# Patient Record
Sex: Female | Born: 1955 | Race: White | Hispanic: No | Smoking: Current every day smoker
Health system: Southern US, Community
[De-identification: ages and names within clinical notes are randomized; demographics above are authoritative.]

## PROBLEM LIST (undated history)

## (undated) DIAGNOSIS — E119 Type 2 diabetes mellitus without complications: Secondary | ICD-10-CM

---

## 2017-11-09 ENCOUNTER — Emergency Department (HOSPITAL_BASED_OUTPATIENT_CLINIC_OR_DEPARTMENT_OTHER): Payer: BLUE CROSS/BLUE SHIELD

## 2017-11-09 ENCOUNTER — Other Ambulatory Visit: Payer: Self-pay

## 2017-11-09 ENCOUNTER — Emergency Department (HOSPITAL_BASED_OUTPATIENT_CLINIC_OR_DEPARTMENT_OTHER)
Admission: EM | Admit: 2017-11-09 | Discharge: 2017-11-10 | Disposition: A | Discharge status: Home / Self Care | Payer: BLUE CROSS/BLUE SHIELD | Attending: Emergency Medicine | Admitting: Emergency Medicine

## 2017-11-09 ENCOUNTER — Encounter (HOSPITAL_BASED_OUTPATIENT_CLINIC_OR_DEPARTMENT_OTHER): Payer: Self-pay

## 2017-11-09 ENCOUNTER — Emergency Department (HOSPITAL_COMMUNITY): Payer: BLUE CROSS/BLUE SHIELD

## 2017-11-09 DIAGNOSIS — H538 Other visual disturbances: Secondary | ICD-10-CM | POA: Insufficient documentation

## 2017-11-09 DIAGNOSIS — R42 Dizziness and giddiness: Secondary | ICD-10-CM | POA: Diagnosis present

## 2017-11-09 DIAGNOSIS — I639 Cerebral infarction, unspecified: Secondary | ICD-10-CM | POA: Diagnosis not present

## 2017-11-09 DIAGNOSIS — R4182 Altered mental status, unspecified: Secondary | ICD-10-CM | POA: Diagnosis not present

## 2017-11-09 HISTORY — DX: Type 2 diabetes mellitus without complications: E11.9

## 2017-11-09 LAB — RAPID URINE DRUG SCREEN, HOSP PERFORMED
AMPHETAMINES: NOT DETECTED
Benzodiazepines: POSITIVE — AB
Cocaine: NOT DETECTED
Opiates: NOT DETECTED
TETRAHYDROCANNABINOL: NOT DETECTED

## 2017-11-09 LAB — CBC
HCT: 37.3 % (ref 36.0–46.0)
Hemoglobin: 13 g/dL (ref 12.0–15.0)
MCH: 31 pg (ref 26.0–34.0)
MCHC: 34.9 g/dL (ref 30.0–36.0)
MCV: 89 fL (ref 78.0–100.0)
PLATELETS: 245 10*3/uL (ref 150–400)
RBC: 4.19 MIL/uL (ref 3.87–5.11)
RDW: 12.9 % (ref 11.5–15.5)
WBC: 7.2 10*3/uL (ref 4.0–10.5)

## 2017-11-09 LAB — TROPONIN I: Troponin I: <0.03 ng/mL (ref <0.03)

## 2017-11-09 LAB — COMPREHENSIVE METABOLIC PANEL
ALBUMIN: 4.3 g/dL (ref 3.5–5.0)
ALK PHOS: 60 U/L (ref 38–126)
ALT: 31 U/L (ref 0–44)
AST: 22 U/L (ref 15–41)
Anion gap: 10 (ref 5–15)
BUN: 19 mg/dL (ref 6–20)
CALCIUM: 9.8 mg/dL (ref 8.9–10.3)
CO2: 25 mmol/L (ref 22–32)
CREATININE: 0.63 mg/dL (ref 0.44–1.00)
Chloride: 104 mmol/L (ref 98–111)
GFR calc Af Amer: >60 mL/min (ref >60)
GFR calc non Af Amer: >60 mL/min (ref >60)
GLUCOSE: 85 mg/dL (ref 70–99)
Potassium: 3.9 mmol/L (ref 3.5–5.1)
SODIUM: 139 mmol/L (ref 135–145)
Total Bilirubin: 0.3 mg/dL (ref 0.3–1.2)
Total Protein: 7 g/dL (ref 6.5–8.1)

## 2017-11-09 LAB — URINALYSIS, ROUTINE W REFLEX MICROSCOPIC
BILIRUBIN URINE: NEGATIVE
Glucose, UA: NEGATIVE mg/dL
Hgb urine dipstick: NEGATIVE
KETONES UR: NEGATIVE mg/dL
LEUKOCYTES UA: NEGATIVE
NITRITE: NEGATIVE
PROTEIN: NEGATIVE mg/dL
Specific Gravity, Urine: 1.01 (ref 1.005–1.030)
pH: 6 (ref 5.0–8.0)

## 2017-11-09 LAB — DIFFERENTIAL
BASOS ABS: 0 10*3/uL (ref 0.0–0.1)
Basophils Relative: 0 %
Eosinophils Absolute: 0.2 10*3/uL (ref 0.0–0.7)
Eosinophils Relative: 2 %
LYMPHS PCT: 40 %
Lymphs Abs: 2.8 10*3/uL (ref 0.7–4.0)
Monocytes Absolute: 0.4 10*3/uL (ref 0.1–1.0)
Monocytes Relative: 6 %
NEUTROS ABS: 3.7 10*3/uL (ref 1.7–7.7)
NEUTROS PCT: 52 %

## 2017-11-09 LAB — PROTIME-INR
INR: 0.94
PROTHROMBIN TIME: 12.5 s (ref 11.4–15.2)

## 2017-11-09 LAB — ETHANOL: Alcohol, Ethyl (B): <10 mg/dL (ref <10)

## 2017-11-09 LAB — CBG MONITORING, ED
GLUCOSE-CAPILLARY: 79 mg/dL (ref 70–99)
Glucose-Capillary: 88 mg/dL (ref 70–99)

## 2017-11-09 LAB — APTT: aPTT: 27 seconds (ref 24–36)

## 2017-11-09 MED ORDER — HALOPERIDOL LACTATE 5 MG/ML IJ SOLN
2.0000 mg | Freq: Once | INTRAMUSCULAR | Status: AC
  Administered 2017-11-09: 2 mg via INTRAVENOUS
  Filled 2017-11-09: qty 1

## 2017-11-09 MED ORDER — PROMETHAZINE HCL 25 MG/ML IJ SOLN
25.0000 mg | Freq: Once | INTRAMUSCULAR | Status: AC
  Administered 2017-11-09: 25 mg via INTRAVENOUS
  Filled 2017-11-09: qty 1

## 2017-11-09 MED ORDER — SODIUM CHLORIDE 0.9 % IV BOLUS
1000.0000 mL | Freq: Once | INTRAVENOUS | Status: AC
  Administered 2017-11-09: 1000 mL via INTRAVENOUS

## 2017-11-09 MED ORDER — LORAZEPAM 2 MG/ML IJ SOLN
2.0000 mg | Freq: Once | INTRAMUSCULAR | Status: AC
  Administered 2017-11-09: 2 mg via INTRAVENOUS
  Filled 2017-11-09: qty 1

## 2017-11-09 MED ORDER — MECLIZINE HCL 25 MG PO TABS
25.0000 mg | ORAL_TABLET | Freq: Once | ORAL | Status: AC
  Administered 2017-11-09: 25 mg via ORAL
  Filled 2017-11-09: qty 1

## 2017-11-09 MED ORDER — METOCLOPRAMIDE HCL 5 MG/ML IJ SOLN
10.0000 mg | Freq: Once | INTRAMUSCULAR | Status: AC
  Administered 2017-11-09: 10 mg via INTRAVENOUS
  Filled 2017-11-09: qty 2

## 2017-11-09 MED ORDER — IOPAMIDOL (ISOVUE-370) INJECTION 76%
100.0000 mL | Freq: Once | INTRAVENOUS | Status: AC | PRN
  Administered 2017-11-09: 100 mL via INTRAVENOUS

## 2017-11-09 NOTE — Progress Notes (Signed)
11/09/17 @ 2111 Pt completely altered, climbed out of scanner and attempting to climb off table. Unable to perform exam in this state.

## 2017-11-09 NOTE — ED Notes (Signed)
Dr. Silverio LayYao notified that pt needs medication prior to MRI.

## 2017-11-09 NOTE — Consult Note (Addendum)
[facility] Tele[facility]urology Consult Services  Date of service:  11/09/17   Impression: Possible stroke, ischemic, localizing to posterior circulation vs. peripheral vestibulopathy (but this would not account for her subjective speech change).  Her age, DM are CV risk factors.  Her last known well is 10:45 but I do not find TPA is currently indicated given NIH SS 0.  I discussed with patient and she understands.  However, if sxs worsen  before 15:15, this may be reconsidered.  Will eval for posterior circ LVO. My clinical suspicion is low.  Her eval is complicated by low to normal BP, which would not be expected in setting of possible acute cerebral ischemic event.  In light of this, must consider and exclude aortic dissection.  Differential diagnosis for stroke mechanism:  1. Cardioembolic stroke  2. Small vessel disease/lacune  3. Thromboembolic, artery-to-artery mechanism  4. Hypercoagulable state-related infarct  5. Transient ischemic attack  6. Thrombotic mechanism, large artery disease   Comments:   Last known well time:  10:45 Door time:  13:15 Time Teleneurologist paged: 13:19 Time Teleneurologist logged in, first attempt:  13:20 [facility] at bedside: 13:24 NIHSS assessment time: 13:24      Recommendations:  STAT CTA head/neck to eval for post circ LVO if present, will require transfer to facility with neuro IR capabilty; though thrombectomy may not be warranted presently given NIH SS 0, she needs to be at place with this capability should she clinically worsen  defer to ED physician to order dx testing to exclude aortic dissection.  [id number] neuro check until 15:15.  Call another SA if she worsens significantly to re-eval option of TPA if aortic dissection excluded  if no worsening by 15:15  and no LVO/aortic dissection, then the following:  aspirin 81mg  qd, first dose now if no medical contraindication  permissive HTN  Admission by Int  Med  Neuroprotective measures suggested:  head of bed elevated no higher than 30 degrees, euglycemia (glucose goal 80-150 ideally, can treat with sliding scale insulin if needed), normal temperature goal < 37.5C, can treat with tylenol as needed), isotonic IV fluids only, avoid hypotension and precipitous drop in BP (treat BP > 220/110 only as permissive HTN encouraged), head of bed elevated no higher than 30 degrees  Inpatient Neurology Consultation Stroke evaluation as per inpatient neurology recommendations  Discussed with ED MD   Please contact [facility] Navigator to reach me if further questions/concerns arise.    ADDENDUM:  CTA head/neck:  Negative  PLAN: Defer to ED physician to order/fu on testing to exclude aortic dissection and care for this if found  No additional acute neuro recs _____________________________________________________________   CC:  dizzy, vertigo, walking trouble, speech change  History of Present Illness: [age]year old woman with DM, migraines who present with onset of "disorientation, dizziness, moving left to right when walking, couldn't type, speech not normal..slow/slurred."  She describes dizziness as vertigo; this was provoked by turning with walking.  She denies prior similar sxs in the past, prior stroke, HTN, HLD, CAD.    She notes mild headache different from her baseline migraine.  She denies chest/low back pain.  She admits to alcohol use 2-3 servings per week with last intake last night.  She denies drug use.  She admits to increase work stressors since May 2019.  Patient denies anticoagulant use, active GI/GU bleeding, hx of ICH/GI or brain malignancy, stroke/head or spine surgery or trauma in last three months, alcohol abuse, liver disease, blood disorder, chemotherapy tx.  Exam:   BP 131/65 then 112/68, glucose 88  NIHSS score: 0  (see below) 1a, 1b, 1c, 2, 3, 4, 5a, 5b, 6a, 6b, 7, 8, 9, 10, 11-- all 0  somewhat variable speech  pattern in that spontaneous speech on interview is normal with word choice, clarity, rhythm/cadence but when formally tested with repetition can do so clearly but does so in a slow/pausing fashion only at this part of exam  gait demonstrates minimal swaying ability to self correct without side base or ataxia, with turning reported to staff that she has vertigo  affect restricted   Imaging: CT head: negative   Medical Data Reviewed:  1.Data reviewed include clinical labs, radiology, medical tests;   2.Tests results discussed with performing or interpreting physician;   3. Prior medical records available reviewed;  4. Additional case history obtained from additional source(s);  5. Independent review of image, tracing or specimen   Medical Decision Making:  - Extensive number of diagnoses or management options considered above.   - Extensive amount of complex data reviewed.   - High risk of complication and/or morbidity or mortality are associated with differential diagnostic considerations above.  - There may be uncertain outcome and increased probability of prolonged functional impairment or high probability of severe prolonged functional impairment associated with some of these differential diagnosis.    Patient was informed the Neurology Consult would happen via TeleHealth consult by way of interactive audio and video telecommunications and consented to receiving care in this manner.

## 2017-11-09 NOTE — ED Triage Notes (Signed)
Pt reports about 10:45 am today she started having trouble typing on her computer, walking a straight line, getting her words out. Pt reports she was walking into things at her work. Pt reports a headache. Pt reports it was hard for her to focus on things and her vision is blurry. MD at bedside.

## 2017-11-09 NOTE — ED Notes (Signed)
Pt to MRI

## 2017-11-09 NOTE — ED Provider Notes (Signed)
[facility] EMERGENCY DEPARTMENT Provider Note   CSN: [id number] Arrival date & time: 11/09/17  1255     History   Chief Complaint Chief Complaint  Patient presents with  . Dizziness  . Altered Mental Status    HPI Valerie Frey is a 62 y.o. female.  Patient is a 62 year old female with a history of diabetes presenting today with acute onset at 1045 today of dizziness which she describes as a spinning sensation, difficulty walking and unable to type.  Patient states she woke up this morning with a mild headache but otherwise felt normal.  She does have a history of migraine headaches but this 1 was a little different and that it was just a mild draggy headache not typical of her migraines.  Around 1045 she was typing at her computer and suddenly was unable to continue typing.  She felt like she could not type the words appropriately and was having some difficulty speaking.  She also felt dizzy at this time when she tried to get up to walk coworker states she was running into the side of the cubicle and running into the wall which is definitely not like her.  She denies any nausea, vomiting, fever, shortness of breath or chest pain.  She is never had anything like this before.  She denies ever having neurologic symptoms with migraines in the past.  No recent head trauma and patient does not take anticoagulation.  She has no prior history of stroke.  Symptoms have not changed since they started at 1045.  The history is provided by the patient and a friend.  Dizziness  Altered Mental Status      No past medical history on file.  There are no active problems to display for this patient.      OB History   None      Home Medications    Prior to Admission medications   Not on File    Family History No family history on file.  Social History Social History   Tobacco Use  . Smoking status: Not on file  Substance Use Topics  . Alcohol use: Not on file  .  Drug use: Not on file     Allergies   Patient has no allergy information on record.   Review of Systems Review of Systems  Neurological: Positive for dizziness.  All other systems reviewed and are negative.    Physical Exam Updated Vital Signs BP 112/68   Pulse 62   Temp 98.4 F (36.9 C) (Oral)   Resp 16   Ht 5' (1.524 m)   Wt 63.5 kg (140 lb)   SpO2 96%   BMI 27.34 kg/m   Physical Exam  Constitutional: She is oriented to person, place, and time. She appears well-developed and well-nourished. No distress.  HENT:  Head: Normocephalic and atraumatic.  Mouth/Throat: Oropharynx is clear and moist.  Eyes: Pupils are equal, round, and reactive to light. Conjunctivae and EOM are normal.  Neck: Normal range of motion. Neck supple.  Cardiovascular: Normal rate, regular rhythm and intact distal pulses.  No murmur heard. Pulmonary/Chest: Effort normal and breath sounds normal. No respiratory distress. She has no wheezes. She has no rales.  Abdominal: Soft. She exhibits no distension. There is no tenderness. There is no rebound and no guarding.  Musculoskeletal: Normal range of motion. She exhibits no edema or tenderness.  Neurological: She is alert and oriented to person, place, and time. She has normal strength. No cranial  nerve deficit or sensory deficit. She displays a negative Romberg sign. Gait abnormal.  When attempting to walk she takes very small steps and is swaying.  Negative Romberg.  When testing for pronator drift arms drift to the left.  No aphasia noted.  Skin: Skin is warm and dry. No rash noted. No erythema.  Psychiatric: She has a normal mood and affect. Her behavior is normal.  Nursing note and vitals reviewed.    ED Treatments / Results  Labs (all labs ordered are listed, but only abnormal results are displayed) Labs Reviewed  RAPID URINE DRUG SCREEN, HOSP PERFORMED - Abnormal; Notable for the following components:      Result Value   Benzodiazepines  POSITIVE (*)    Barbiturates   (*)    Value: Result not available. Reagent lot number recalled by manufacturer.   All other components within normal limits  ETHANOL  PROTIME-INR  APTT  CBC  DIFFERENTIAL  COMPREHENSIVE METABOLIC PANEL  TROPONIN I  URINALYSIS, ROUTINE W REFLEX MICROSCOPIC  CBG MONITORING, ED    EKG None  Radiology Ct Angio Head W Or Wo Contrast  Result Date: 11/09/2017 CLINICAL DATA:  Ataxia. Dizziness and blurred vision. Rule out stroke. Diabetes. EXAM: CT ANGIOGRAPHY HEAD AND NECK TECHNIQUE: Multidetector CT imaging of the head and neck was performed using the standard protocol during bolus administration of intravenous contrast. Multiplanar CT image reconstructions and MIPs were obtained to evaluate the vascular anatomy. Carotid stenosis measurements (when applicable) are obtained utilizing NASCET criteria, using the distal internal carotid diameter as the denominator. CONTRAST:  ISOVUE-370 IOPAMIDOL (ISOVUE-370) INJECTION 76% COMPARISON:  CT head 11/09/2017 FINDINGS: CTA NECK FINDINGS Aortic arch: Mild atherosclerotic calcification aortic arch. Proximal great vessels widely patent and normal Right carotid system: Normal right carotid system without atherosclerotic disease. Negative for dissection or stenosis Left carotid system: Normal left carotid system without atherosclerotic disease. Negative for stenosis or dissection Vertebral arteries: Both vertebral arteries widely patent without stenosis. Skeleton: negative Other neck: Negative for mass or adenopathy in the neck. Upper chest: Azygos lobe fissure on the right. Mild apical emphysema and scarring bilaterally. Review of the MIP images confirms the above findings CTA HEAD FINDINGS Anterior circulation: Cavernous carotid is normal bilaterally. Anterior and middle cerebral arteries normal Posterior circulation: Both vertebral arteries widely patent to the basilar. Basilar widely patent. PICA, AICA, superior cerebellar,  and posterior cerebral arteries patent bilaterally without stenosis. Venous sinuses: Normal Anatomic variants: None Delayed phase: Normal enhancement on delayed imaging Review of the MIP images confirms the above findings IMPRESSION: Essentially normal CTA head and neck. No significant stenosis. Minimal atherosclerotic disease aortic arch. Apical emphysema and scarring bilaterally. Electronically Signed   By: Marlan Palau M.D.   On: 11/09/2017 14:55   Ct Angio Neck W And/or Wo Contrast  Result Date: 11/09/2017 CLINICAL DATA:  Ataxia. Dizziness and blurred vision. Rule out stroke. Diabetes. EXAM: CT ANGIOGRAPHY HEAD AND NECK TECHNIQUE: Multidetector CT imaging of the head and neck was performed using the standard protocol during bolus administration of intravenous contrast. Multiplanar CT image reconstructions and MIPs were obtained to evaluate the vascular anatomy. Carotid stenosis measurements (when applicable) are obtained utilizing NASCET criteria, using the distal internal carotid diameter as the denominator. CONTRAST:  ISOVUE-370 IOPAMIDOL (ISOVUE-370) INJECTION 76% COMPARISON:  CT head 11/09/2017 FINDINGS: CTA NECK FINDINGS Aortic arch: Mild atherosclerotic calcification aortic arch. Proximal great vessels widely patent and normal Right carotid system: Normal right carotid system without atherosclerotic disease. Negative for dissection or  stenosis Left carotid system: Normal left carotid system without atherosclerotic disease. Negative for stenosis or dissection Vertebral arteries: Both vertebral arteries widely patent without stenosis. Skeleton: negative Other neck: Negative for mass or adenopathy in the neck. Upper chest: Azygos lobe fissure on the right. Mild apical emphysema and scarring bilaterally. Review of the MIP images confirms the above findings CTA HEAD FINDINGS Anterior circulation: Cavernous carotid is normal bilaterally. Anterior and middle cerebral arteries normal Posterior  circulation: Both vertebral arteries widely patent to the basilar. Basilar widely patent. PICA, AICA, superior cerebellar, and posterior cerebral arteries patent bilaterally without stenosis. Venous sinuses: Normal Anatomic variants: None Delayed phase: Normal enhancement on delayed imaging Review of the MIP images confirms the above findings IMPRESSION: Essentially normal CTA head and neck. No significant stenosis. Minimal atherosclerotic disease aortic arch. Apical emphysema and scarring bilaterally. Electronically Signed   By: Marlan Palauharles  Clark M.D.   On: 11/09/2017 14:55   Ct Head Code Stroke Wo Contrast  Addendum Date: 11/09/2017   ADDENDUM REPORT: 11/09/2017 14:48 ADDENDUM: Study discussed by telephone with Dr. Gwyneth SproutWHITNEY Jinan Biggins on 11/09/2017 at 1328 hours. Electronically Signed   By: Odessa FlemingH  Hall M.D.   On: 11/09/2017 14:48   Result Date: 11/09/2017 CLINICAL DATA:  Code stroke. 62 year old female with dizziness and blurred vision for 3 hours. EXAM: CT HEAD WITHOUT CONTRAST TECHNIQUE: Contiguous axial images were obtained from the base of the skull through the vertex without intravenous contrast. COMPARISON:  None. FINDINGS: Brain: Normal cerebral volume. No midline shift, ventriculomegaly, mass effect, evidence of mass lesion, intracranial hemorrhage or evidence of cortically based acute infarction. Gray-white matter differentiation is within normal limits throughout the brain. Vascular: Calcified atherosclerosis at the skull base. No suspicious intracranial vascular hyperdensity. Skull: Negative. Sinuses/Orbits: Trace fluid layering in the left sphenoid sinus. Other visible paranasal sinuses and mastoids are well pneumatized. The bilateral tympanic cavities are clear. Other: Visualized orbits and scalp soft tissues are within normal limits. ASPECTS [facility]([place] Stroke Program Early CT Score) - Ganglionic level infarction (caudate, lentiform nuclei, internal capsule, insula, M1-M3 cortex): 7 - Supraganglionic  infarction (M4-M6 cortex): 3 Total score (0-10 with 10 being normal): 10 IMPRESSION: 1. Normal noncontrast CT appearance of the brain. ASPECTS is 10 2. Trace fluid in the left sphenoid sinus, but significance doubtful in light of otherwise normal paranasal sinus aeration. Electronically Signed: By: Odessa FlemingH  Hall M.D. On: 11/09/2017 13:28    Procedures Procedures (including critical care time)  Medications Ordered in ED Medications - No data to display   Initial Impression / Assessment and Plan / ED Course  I have reviewed the triage vital signs and the nursing notes.  Pertinent labs & imaging results that were available during my care of the patient were reviewed by me and considered in my medical decision making (see chart for details).    Patient presenting today with concern for possible code stroke.  Symptoms started at 1045 today when she was last seen normal.  Patient is ataxic and initially was having some speech issues and difficulty typing but is not aphasic here.  She states her symptoms have not significantly improved since they started.  She does have a history of migraine headaches but is never had complex migraines in the past.  She is complaining of a headache here.  Blood sugar is 88.  No prior history of stroke.  Code stroke was activated.  Labs and CT are pending.  Tele-neurology pending  1:52 PM Our initial NIH is 3 however with tele-neurology she  documents an NIH of 0.  She discussed TPA with the patient but since his not a great candidate at this time except that she is within the window.  Tele-neurology recommends CTA of the head, neck and CT dissection of the chest to ensure no evidence of aortic dissection.  Patient is complaining of no chest pain or shortness of breath.  Blood pressures have been relatively low at 112/68.  Patient also will be given Reglan for possible complex migraine.   3:41 PM Our radiology techs here stated they could not do the CT dissection study of  the chest based on timing.  However low suspicion for dissection as patient has no chest pain, shortness of breath and other than diabetes has no risk factors.  She has equal pulses bilaterally and is denying any numbness or tingling in her arms or legs.  Labs are positive for benzodiazepines but no other acute findings.  CTA of head and neck neg.  Spoke with Dr. Tia Alert and feel if pt's MRI is neg she can be treated for peripheral vertigo and d/c but if positive can be admitted for stroke work up.  Final Clinical Impressions(s) / ED Diagnoses   Final diagnoses:  Dizziness    ED Discharge Orders    None       Gwyneth Sprout, MD 11/09/17 1546

## 2017-11-09 NOTE — ED Provider Notes (Signed)
  Physical Exam  BP 97/83   Pulse 82   Temp 98.4 F (36.9 C)   Resp 13   Ht 5' (1.524 m)   Wt 63.5 kg (140 lb)   SpO2 99%   BMI 27.34 kg/m   Physical Exam  ED Course/Procedures     Procedures  MDM  [facility]. CTA head/neck unremarkable. NIH 0. Has nl strength and sensation throughout. Patient still has headaches so will give phenergan.   9 pm Patient was agitated in MRI. Ordered ativan 2 mg IV.   10:30 pm Patient still unable to tolerate MRI. Patient back to her room and sleeping comfortably. I talked to Dr. Otelia LimesLindzen from neurology. He wants me to try MRI brain again.   11:32 PM MRI pending. Will give haldol right before MRI. Signed out to Dr. Blinda LeatherwoodPollina in the ED.      Charlynne PanderYao, David Hsienta, MD 11/09/17 [phone]

## 2017-11-10 MED ORDER — MECLIZINE HCL 25 MG PO TABS: 25.0000 mg | ORAL_TABLET | Freq: Three times a day (TID) | ORAL | 0 refills | Status: AC | PRN

## 2017-11-10 NOTE — ED Notes (Signed)
Pt's friend is going home.  Please call her once a decision is made on pt's disposition.

## 2017-11-10 NOTE — ED Notes (Signed)
Pt to MRI again.

## 2017-11-10 NOTE — ED Provider Notes (Signed)
Assumed care of patient while MRI was pending.  MRI has been performed and results are normal brain.  No evidence of stroke or other abnormality.  Patient will be appropriate for discharge, outpatient follow-up with PCP, neurology.  Treatment with meclizine.   Valerie Frey, Christopher J, MD 11/10/17 [id number]

## 2017-11-10 NOTE — Discharge Instructions (Signed)
Schedule follow-up with your doctor as well as 1 of the 2 listed neurology groups for repeat evaluation.

## 2021-07-21 ENCOUNTER — Encounter (HOSPITAL_COMMUNITY): Payer: Self-pay | Admitting: Emergency Medicine

## 2021-07-21 ENCOUNTER — Emergency Department (HOSPITAL_COMMUNITY)
Admission: EM | Admit: 2021-07-21 | Discharge: 2021-07-21 | Disposition: A | Discharge status: Home / Self Care | Payer: Worker's Compensation | Attending: Emergency Medicine | Admitting: Emergency Medicine

## 2021-07-21 ENCOUNTER — Emergency Department (HOSPITAL_COMMUNITY): Payer: Worker's Compensation

## 2021-07-21 DIAGNOSIS — W19XXXA Unspecified fall, initial encounter: Secondary | ICD-10-CM

## 2021-07-21 DIAGNOSIS — S52131A Displaced fracture of neck of right radius, initial encounter for closed fracture: Secondary | ICD-10-CM | POA: Diagnosis not present

## 2021-07-21 DIAGNOSIS — E119 Type 2 diabetes mellitus without complications: Secondary | ICD-10-CM | POA: Diagnosis not present

## 2021-07-21 DIAGNOSIS — S42401A Unspecified fracture of lower end of right humerus, initial encounter for closed fracture: Secondary | ICD-10-CM

## 2021-07-21 DIAGNOSIS — M25532 Pain in left wrist: Secondary | ICD-10-CM | POA: Insufficient documentation

## 2021-07-21 DIAGNOSIS — Y990 Civilian activity done for income or pay: Secondary | ICD-10-CM | POA: Diagnosis not present

## 2021-07-21 DIAGNOSIS — S0181XA Laceration without foreign body of other part of head, initial encounter: Secondary | ICD-10-CM | POA: Diagnosis not present

## 2021-07-21 DIAGNOSIS — S59901A Unspecified injury of right elbow, initial encounter: Secondary | ICD-10-CM | POA: Diagnosis present

## 2021-07-21 DIAGNOSIS — Z794 Long term (current) use of insulin: Secondary | ICD-10-CM | POA: Diagnosis not present

## 2021-07-21 DIAGNOSIS — Z23 Encounter for immunization: Secondary | ICD-10-CM | POA: Insufficient documentation

## 2021-07-21 DIAGNOSIS — W01198A Fall on same level from slipping, tripping and stumbling with subsequent striking against other object, initial encounter: Secondary | ICD-10-CM | POA: Insufficient documentation

## 2021-07-21 MED ORDER — TETANUS-DIPHTH-ACELL PERTUSSIS 5-2.5-18.5 LF-MCG/0.5 IM SUSY
0.5000 mL | PREFILLED_SYRINGE | Freq: Once | INTRAMUSCULAR | Status: AC
  Administered 2021-07-21: 0.5 mL via INTRAMUSCULAR
  Filled 2021-07-21: qty 0.5

## 2021-07-21 MED ORDER — OXYCODONE HCL 5 MG PO TABS: 5.0000 mg | ORAL_TABLET | Freq: Four times a day (QID) | ORAL | 0 refills | Status: AC | PRN

## 2021-07-21 MED ORDER — ACETAMINOPHEN 325 MG PO TABS
650.0000 mg | ORAL_TABLET | Freq: Once | ORAL | Status: AC
  Administered 2021-07-21: 650 mg via ORAL
  Filled 2021-07-21: qty 2

## 2021-07-21 MED ORDER — LIDOCAINE HCL (PF) 1 % IJ SOLN
10.0000 mL | Freq: Once | INTRAMUSCULAR | Status: AC
  Administered 2021-07-21: 10 mL
  Filled 2021-07-21: qty 30

## 2021-07-21 MED ORDER — BACITRACIN ZINC 500 UNIT/GM EX OINT
TOPICAL_OINTMENT | Freq: Two times a day (BID) | CUTANEOUS | Status: DC
  Filled 2021-07-21: qty 2.7

## 2021-07-21 NOTE — ED Notes (Signed)
Pt transported to CT ?

## 2021-07-21 NOTE — ED Notes (Signed)
Ortho at bedside.

## 2021-07-21 NOTE — Discharge Instructions (Addendum)
3 absorbable sutures were placed in your face today.  They should dissolve on their own and therefore no follow-up is required for removal.  I do however recommend follow-up with your primary care doctor for a wound check in 1 week.  In the interim, please rinse your wound with soap and water and pat dry, do not scrub. ? ?Additionally, you have an elbow fracture which will need to be followed up with orthopedics.  I have given you a referral with a number to call to schedule an appointment in the next few days for continued evaluation and management of this. ? ?Return if development of any new or worsening symptoms ?

## 2021-07-21 NOTE — ED Triage Notes (Signed)
Per pt, states she had a mechanical fall at work-no LOC, did not hit head-complaining of right wrist/arm pain, left arm pain, right face pain and small laceration above right side of lip-no blood thinners ?

## 2021-07-21 NOTE — ED Provider Notes (Signed)
She is afebrile, nontoxic-appearing, and in no acute distress with reassuring vital signs.  She ?[facility]-EMERGENCY DEPT ?Provider Note ? ? ?CSN: [id number] ?Arrival date & time: 07/21/21  1534 ? ?  ? ?History ? ?Chief Complaint  ?Patient presents with  ? Fall  ? ? ?Valerie PippinsSelena Frey is a 66 y.o. female. ? ?Patient with history of diabetes presents today with complaints of fall.  She states that same occurred while she was working today she tripped and fell face first on carpet over concrete.  States the she attempted to catch herself with her right arm but her face struck the ground.  She denies any loss of consciousness, she was able to get off the floor and ambulate without assistance.  She is not anticoagulated.  She endorses headache, right-sided face pain, right shoulder, elbow, and wrist pain with left wrist pain as well.  She denies fevers, chills, vision changes, lightheadedness, dizziness, chest pain, shortness of breath.  Small cut noted to her face, unknown date of last tetanus. ? ?The history is provided by the patient. No language interpreter was used.  ?Fall ?Associated symptoms include headaches.  ? ?  ? ?Home Medications ?Prior to Admission medications   ?Medication Sig Start Date End Date Taking? Authorizing Provider  ?celecoxib (CELEBREX) 100 MG capsule Take 100 mg by mouth 2 (two) times daily. 11/04/17   [provider]  ?LANTUS SOLOSTAR 100 UNIT/ML Solostar Pen Inject 2-10 Units into the skin 3 (three) times daily. 11/07/17   [provider]  ?meclizine (ANTIVERT) 25 MG tablet Take 1 tablet (25 mg total) by mouth 3 (three) times daily as needed for dizziness. 11/10/17   Gilda CreasePollina, Christopher J, MD  ?nystatin (MYCOSTATIN) 100000 UNIT/ML suspension Take 5 mLs by mouth 4 (four) times daily. 10/19/17   [provider]  ?PREMPRO 0.3-1.5 MG tablet Take 1 tablet by mouth daily. 10/29/17   [provider]  ?   ? ?Allergies    ?Sulfa antibiotics, Codeine,  and Metformin   ? ?Review of Systems   ?Review of Systems  ?Constitutional:  Negative for chills and fever.  ?Eyes:  Negative for visual disturbance.  ?Gastrointestinal:  Negative for diarrhea, nausea and vomiting.  ?Musculoskeletal:  Positive for arthralgias and myalgias. Negative for back pain, gait problem, neck pain and neck stiffness.  ?Neurological:  Positive for headaches. Negative for dizziness, tremors, seizures, syncope, facial asymmetry, speech difficulty, weakness, light-headedness and numbness.  ?Psychiatric/Behavioral:  Negative for confusion and decreased concentration.   ?All other systems reviewed and are negative. ? ?Physical Exam ?Updated Vital Signs ?BP 131/79 (BP Location: Right Arm)   Pulse 87   Temp 99.4 ?F (37.4 ?C) (Oral)   Resp 18   SpO2 96%  ?Physical Exam ?Vitals and nursing note reviewed.  ?Constitutional:   ?   General: She is not in acute distress. ?   Appearance: Normal appearance. She is normal weight. She is not ill-appearing, toxic-appearing or diaphoretic.  ?HENT:  ?   Head: Normocephalic and atraumatic.  ?   Comments: No battle sign or raccoon eyes ? ?1 cm curved laceration noted to the philtrum on the right side.  No active bleeding. ? ?Face and jaw without trismus or crepitus. ?Eyes:  ?   Extraocular Movements: Extraocular movements intact.  ?   Pupils: Pupils are equal, round, and reactive to light.  ?Cardiovascular:  ?   Rate and Rhythm: Normal rate.  ?Pulmonary:  ?   Effort: Pulmonary effort is normal. No  respiratory distress.  ?Abdominal:  ?   General: Abdomen is flat.  ?   Palpations: Abdomen is soft.  ?Musculoskeletal:  ?   Cervical back: Normal range of motion and neck supple.  ?   Comments: Tenderness noted to palpation of the right shoulder, right elbow, and right wrist as well as the left wrist.  Decreased ROM due to pain.  No obvious swelling or deformity noted.  Capillary refill less than 2 seconds.  Radial pulses 2+ bilaterally.  No tenderness to palpation of  cervical, thoracic, or lumbar spine.  ?Skin: ?   General: Skin is warm and dry.  ?Neurological:  ?   General: No focal deficit present.  ?   Mental Status: She is alert.  ?Psychiatric:     ?   Mood and Affect: Mood normal.     ?   Behavior: Behavior normal.  ? ? ?ED Results / Procedures / Treatments   ?Labs ?(all labs ordered are listed, but only abnormal results are displayed) ?Labs Reviewed - No data to display ? ?EKG ?None ? ?Radiology ?DG Shoulder Right ? ?Result Date: 07/21/2021 ?CLINICAL DATA:  Status post fall. EXAM: RIGHT SHOULDER - 2+ VIEW COMPARISON:  None. FINDINGS: There is no evidence of fracture or dislocation. Mild to moderate severity degenerative changes are seen involving the right acromioclavicular joint. Soft tissues are unremarkable. IMPRESSION: Mild to moderate severity degenerative changes of the right acromioclavicular joint. Electronically Signed   By: Aram Candela M.D.   On: 07/21/2021 17:14  ? ?DG Elbow Complete Right ? ?Result Date: 07/21/2021 ?CLINICAL DATA:  Status post fall. EXAM: RIGHT ELBOW - COMPLETE 3+ VIEW COMPARISON:  None. FINDINGS: In ill-defined deformity of indeterminate age is seen involving the neck of the proximal right radius. A small chronic deformity is seen along the coronoid process of the proximal right ulna. There is no evidence of dislocation. A posterior fat pad is seen along the distal right humerus. IMPRESSION: 1. Age-indeterminate fracture of the neck of the proximal right radius. 2. Small chronic deformity of the coronoid process of the proximal right ulna. 3. Posterior humeral fat pad visualization, suggestive of an occult, nondisplaced fracture. MRI correlation is recommended. Electronically Signed   By: Aram Candela M.D.   On: 07/21/2021 17:13  ? ?DG Wrist Complete Left ? ?Result Date: 07/21/2021 ?CLINICAL DATA:  Status post fall. EXAM: LEFT WRIST - COMPLETE 3+ VIEW COMPARISON:  None. FINDINGS: There is no evidence of fracture or dislocation. Small  radiopaque fixation plates are seen along the base of the first left metacarpal and proximal aspect of the second left metacarpal. Mild degenerative changes are seen along the carpometacarpal articulation of the left thumb. Soft tissues are unremarkable. IMPRESSION: 1. No acute findings. 2. Postoperative changes involving the first and second metacarpals. Electronically Signed   By: Aram Candela M.D.   On: 07/21/2021 17:07  ? ?DG Wrist Complete Right ? ?Result Date: 07/21/2021 ?CLINICAL DATA:  Status post fall. EXAM: RIGHT WRIST - COMPLETE 3+ VIEW COMPARISON:  None. FINDINGS: There is no evidence of fracture or dislocation. Moderate to marked severity chronic and degenerative changes are seen involving the right trapezium and right capitate bones as well as the carpometacarpal articulation of the right thumb. Soft tissues are unremarkable. IMPRESSION: Moderate to marked severity chronic and degenerative changes as described above. Electronically Signed   By: Aram Candela M.D.   On: 07/21/2021 17:09  ? ?CT Head Wo Contrast ? ?Result Date: 07/21/2021 ?CLINICAL DATA:  Mechanical  fall, right facial pain and laceration EXAM: CT HEAD WITHOUT CONTRAST CT MAXILLOFACIAL WITHOUT CONTRAST CT CERVICAL SPINE WITHOUT CONTRAST TECHNIQUE: Multidetector CT imaging of the head, cervical spine, and maxillofacial structures were performed using the standard protocol without intravenous contrast. Multiplanar CT image reconstructions of the cervical spine and maxillofacial structures were also generated. RADIATION DOSE REDUCTION: This exam was performed according to the departmental dose-optimization program which includes automated exposure control, adjustment of the mA and/or kV according to patient size and/or use of iterative reconstruction technique. COMPARISON:  Multiple exams, including 11/09/2017 and MRI of 11/10/2017 FINDINGS: CT HEAD FINDINGS Brain: The brainstem, cerebellum, cerebral peduncles, thalami, basal ganglia,  basilar cisterns, and ventricular system appear within normal limits. No intracranial hemorrhage, mass lesion, or acute CVA. Vascular: Unremarkable Skull: Unremarkable Other: No supplemental non-categorized f

## 2023-08-22 IMAGING — CT CT MAXILLOFACIAL W/O CM
3 series · 14 of 47 positions shown, 16 images · non-contrast
Comparison: Multiple exams, including 11/09/2017 and MRI of
11/10/2017

CLINICAL DATA: Mechanical fall, right facial pain and laceration



[Series 3: max soft · axial · 0.30mm/px · z∈[+1157,+1293]mm · 8 of 80 slices shown, 10 images]
[im 6/80  brain]
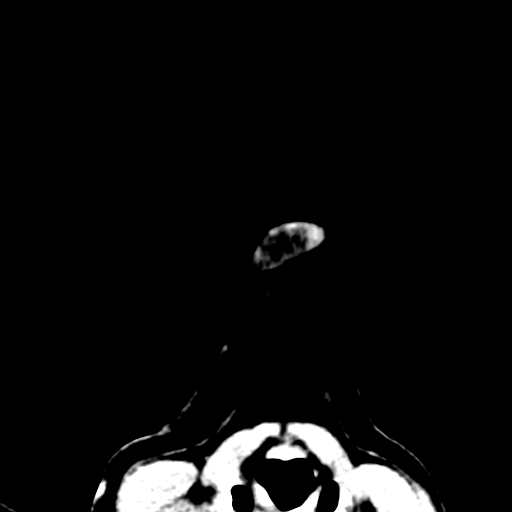
[im 6/80  bone]
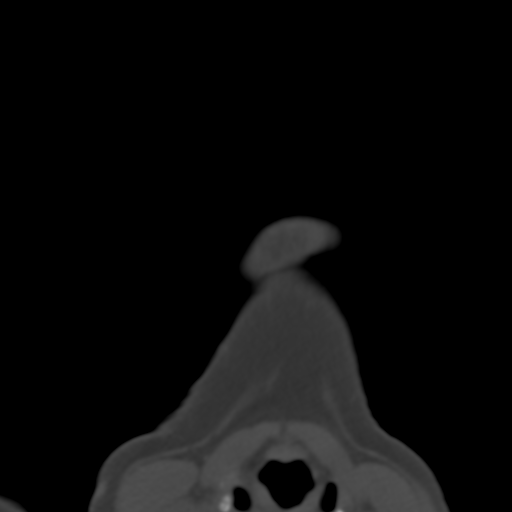
[im 17/80  bone]
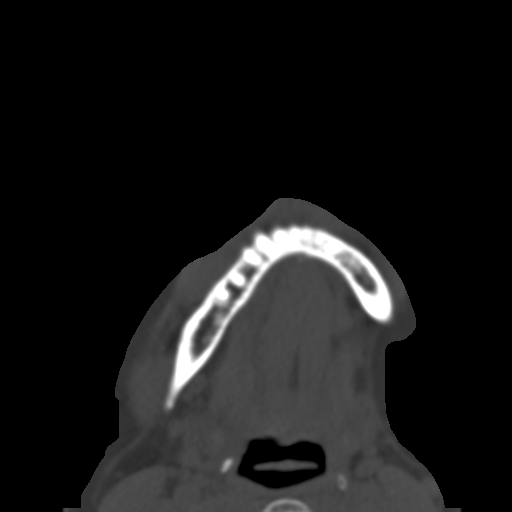
[im 25/80  bone]
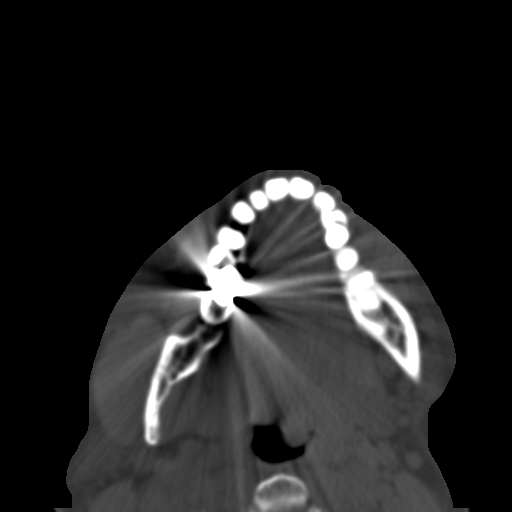
[im 36/80  bone]
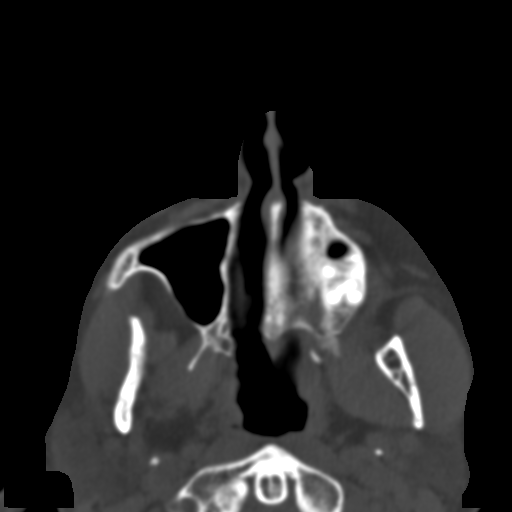
[im 44/80  brain]
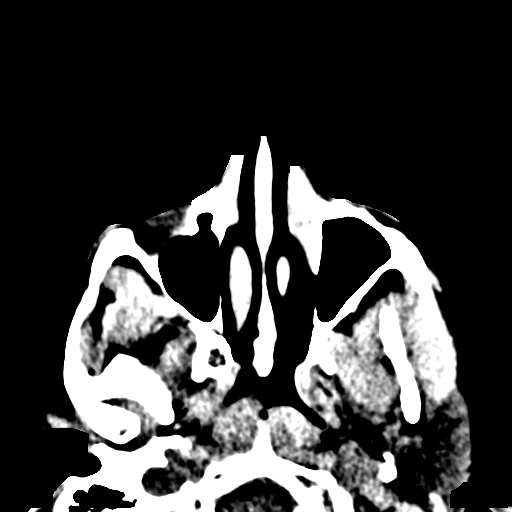
[im 44/80  bone]
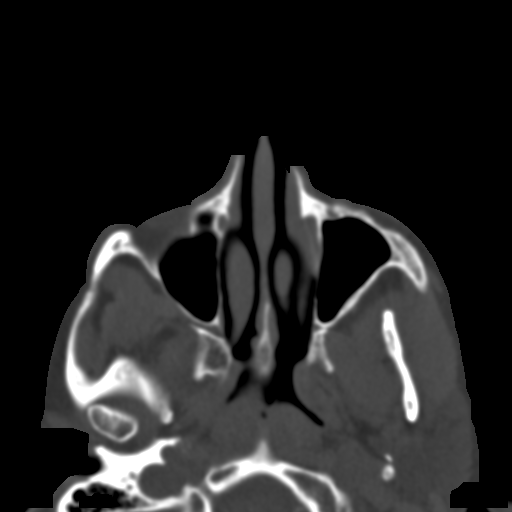
[im 55/80  bone]
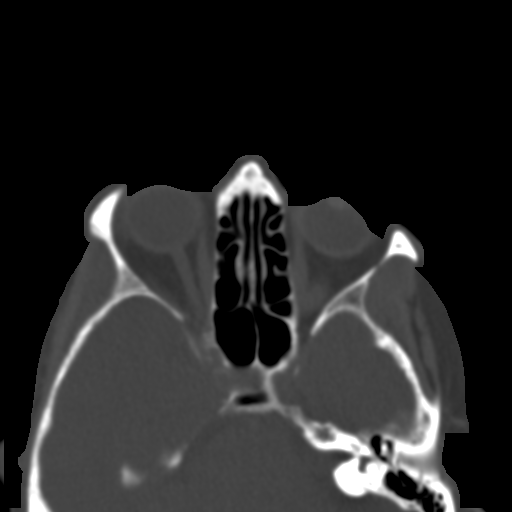
[im 63/80  bone]
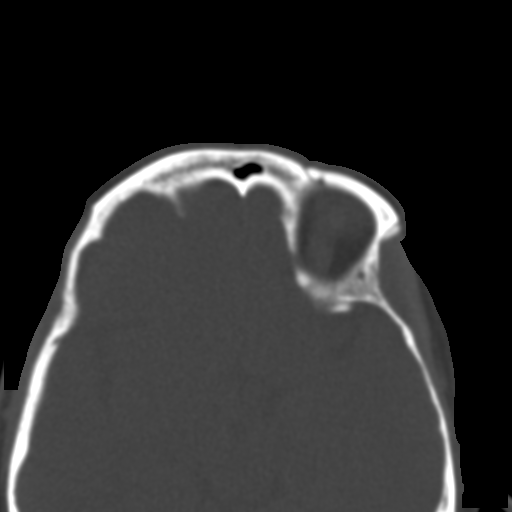
[im 74/80  bone]
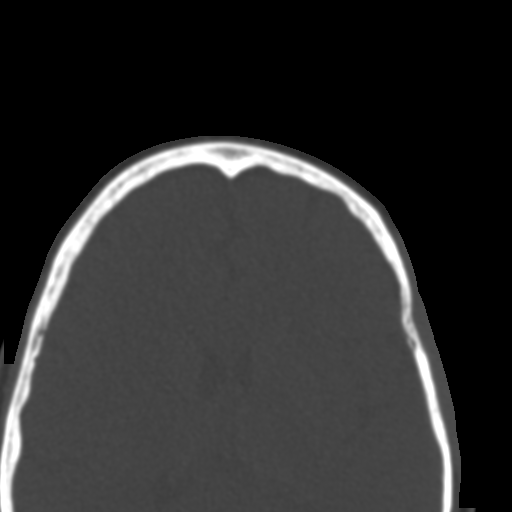

[Series 7: coronal soft · coronal · 0.31mm/px · 3 of 70 slices shown]
[im 24/70  bone]
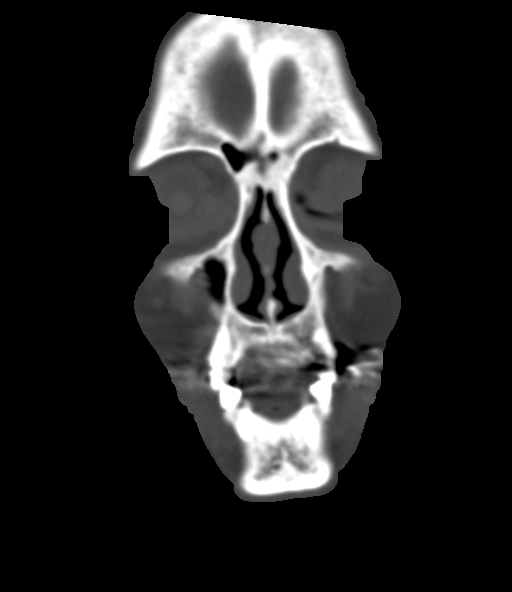
[im 31/70  bone]
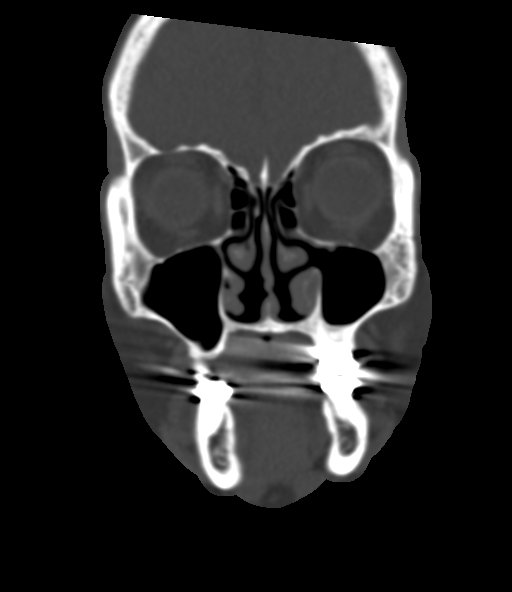
[im 39/70  bone]
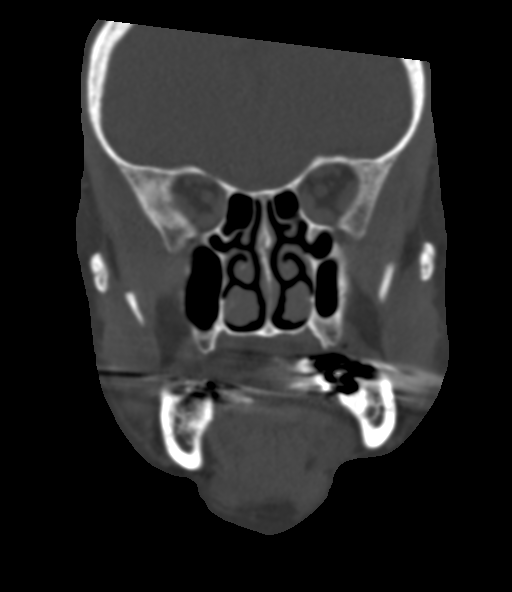

[Series 8: sagittal soft · sagittal · 0.27mm/px · 3 of 81 slices shown]
[im 27/81  bone]
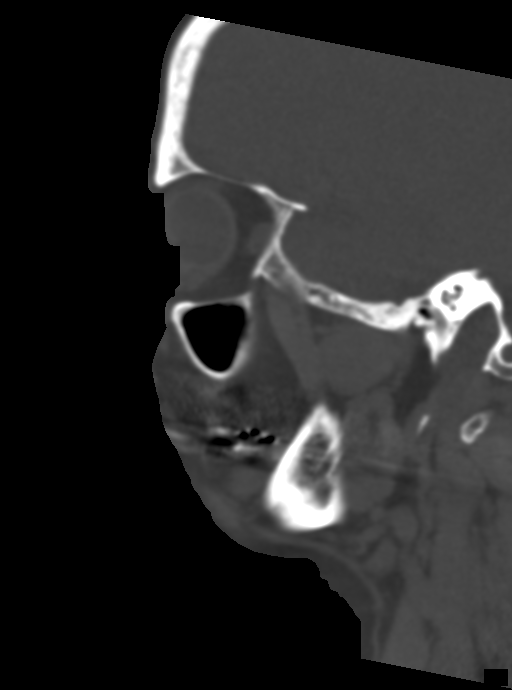
[im 41/81  bone]
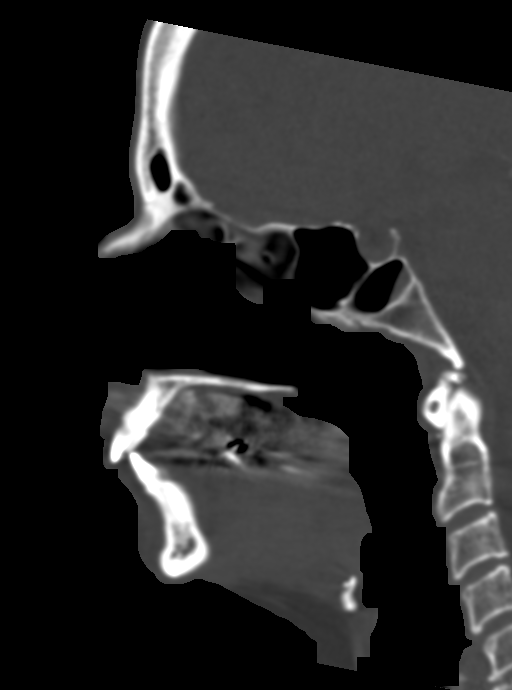
[im 54/81  bone]
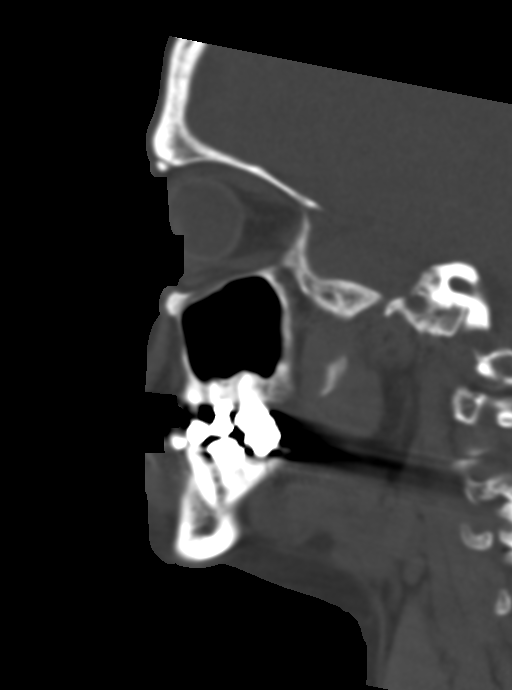

[14 of 47 positions shown; findings below may reference images not displayed]

FINDINGS: CT HEAD FINDINGS

Brain: The brainstem, cerebellum, cerebral peduncles, thalami, basal
ganglia, basilar cisterns, and ventricular system appear within
normal limits. No intracranial hemorrhage, mass lesion, or acute
CVA.

Vascular: Unremarkable

Skull: Unremarkable

Other: No supplemental non-categorized findings.

CT MAXILLOFACIAL FINDINGS

Osseous: No facial fracture identified.

Orbits: Unremarkable

Sinuses: Mild chronic left sphenoid sinusitis. Suspected prior left
maxillary antrostomy.

Soft tissues: Small soft tissue defect just above the right upper
lip on image 53 series 4 could represent the reported laceration.

CT CERVICAL SPINE FINDINGS

Alignment: No vertebral subluxation is observed.

Skull base and vertebrae: No cervical spine fracture or acute bony
abnormality. Loss of intervertebral disc height at C5-6 attributable
to degenerative disc disease. Multilevel degenerative facet
arthropathy.

Soft tissues and spinal canal: Unremarkable

Disc levels: Uncinate and facet spurring cause mild osseous
foraminal stenosis on the right at C4-5, and prominent left
foraminal stenosis at C5-6. Suspected borderline central narrowing
of the thecal sac at C5-6.

Upper chest: Biapical pleuroparenchymal scarring. Emphysema noted at
the lung apices.

Other: No supplemental non-categorized findings.
IMPRESSION: 1. No acute intracranial findings. No facial fracture or cervical
spine fracture.
2. Small soft tissue defect just above the right upper lip
potentially reflecting the reported laceration.
3. Spurring causes prominent left foraminal impingement at C5-6 and
mild right foraminal impingement at C4-5.
4.  Emphysema ([id number]).
5. Mild chronic left sphenoid sinusitis.

## 2023-08-22 IMAGING — CR DG WRIST COMPLETE 3+V*R*
4 series · 4 of 4 positions shown · non-contrast
Comparison: None.

CLINICAL DATA: Status post fall.

EXAM:
RIGHT WRIST - COMPLETE 3+ VIEW

[x wrist pa right]
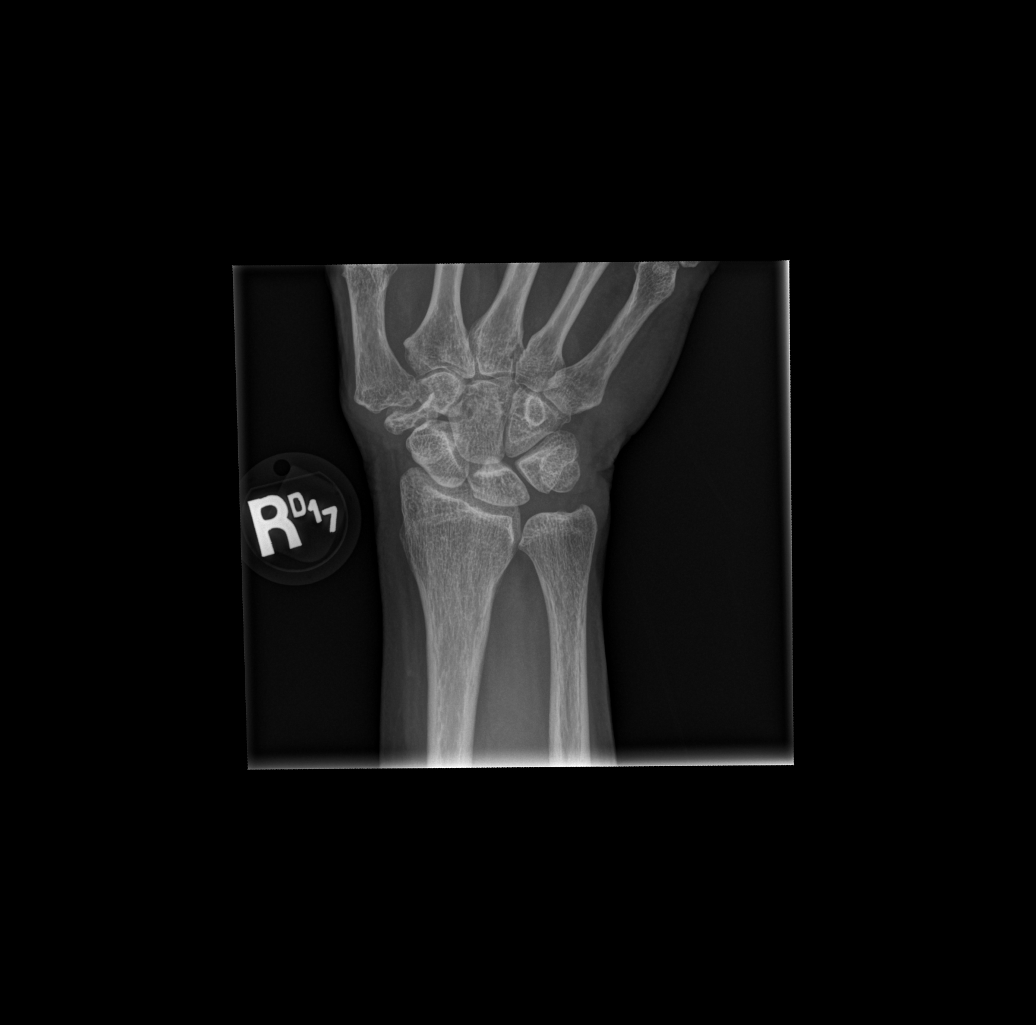

[x wrist obl right]
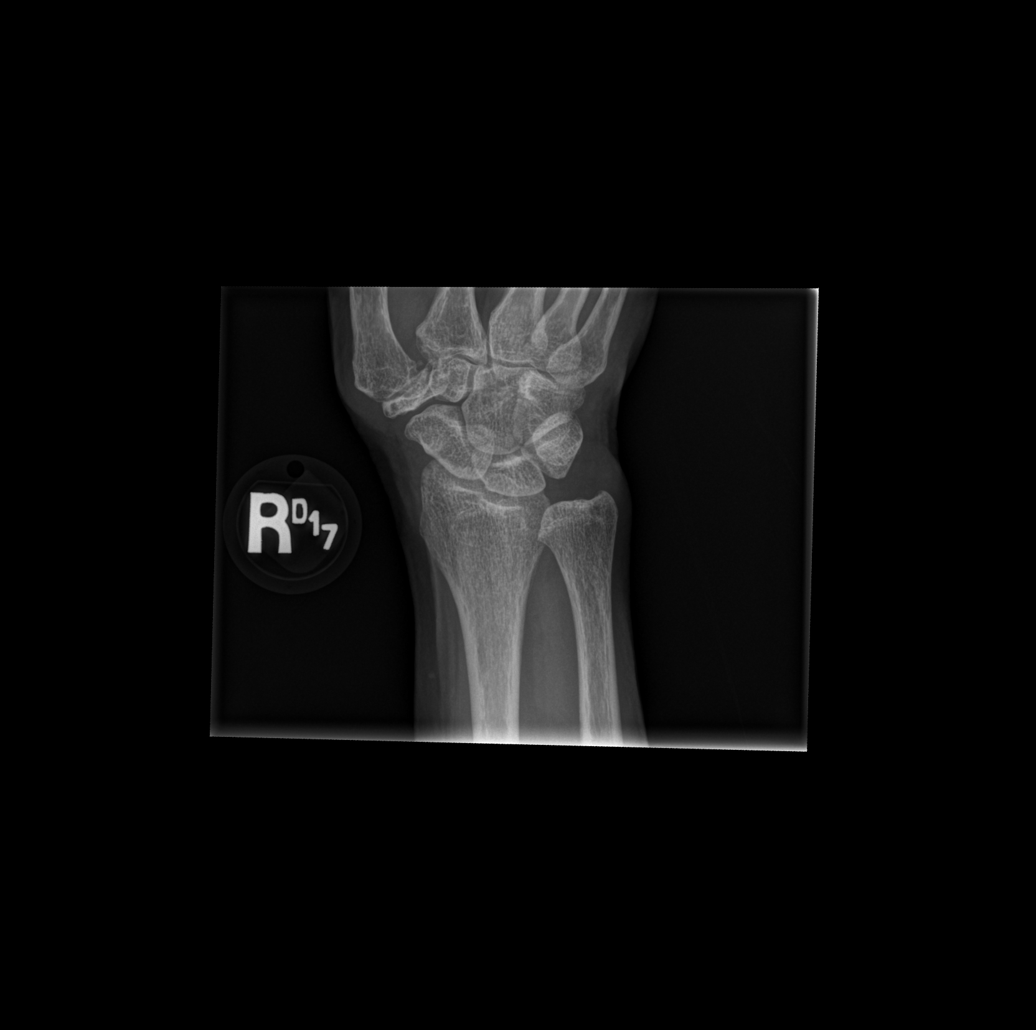

[x wrist lat right]
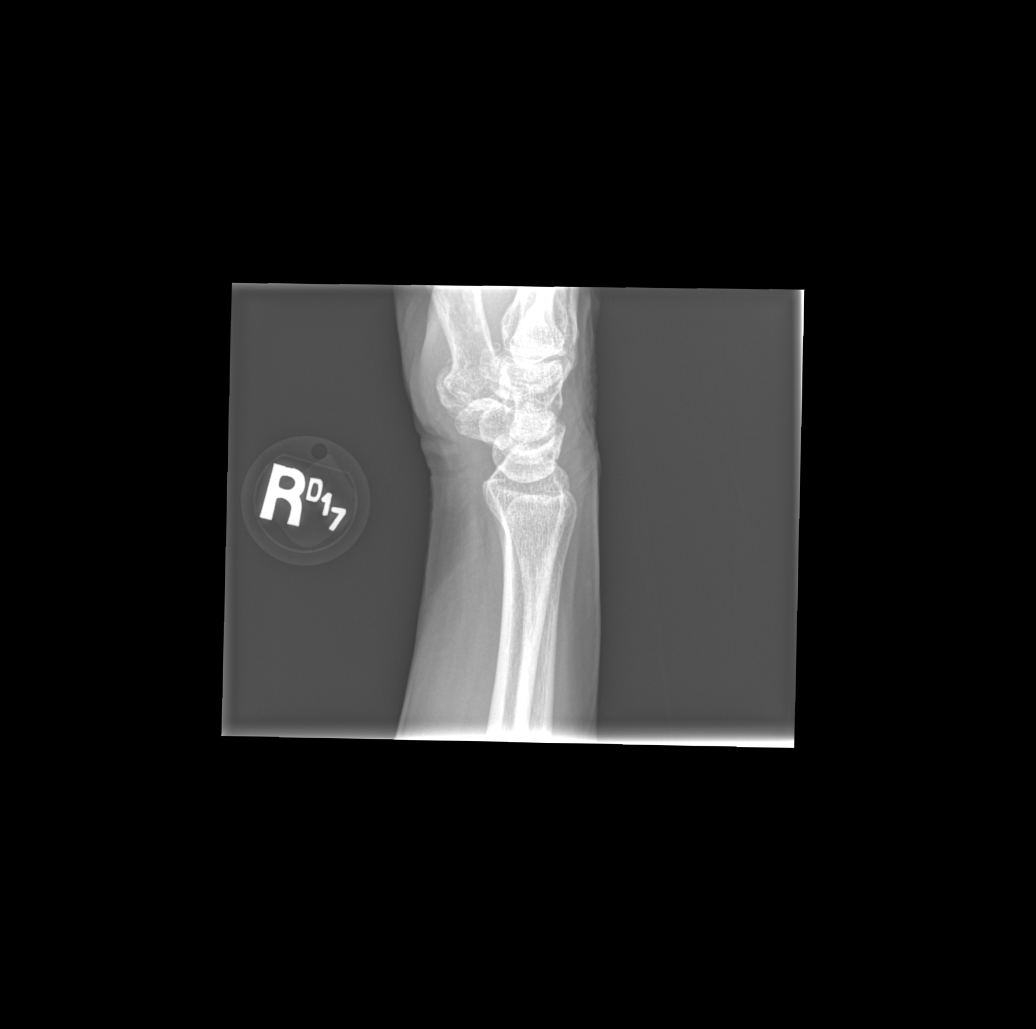

[x wrist navicular view right]
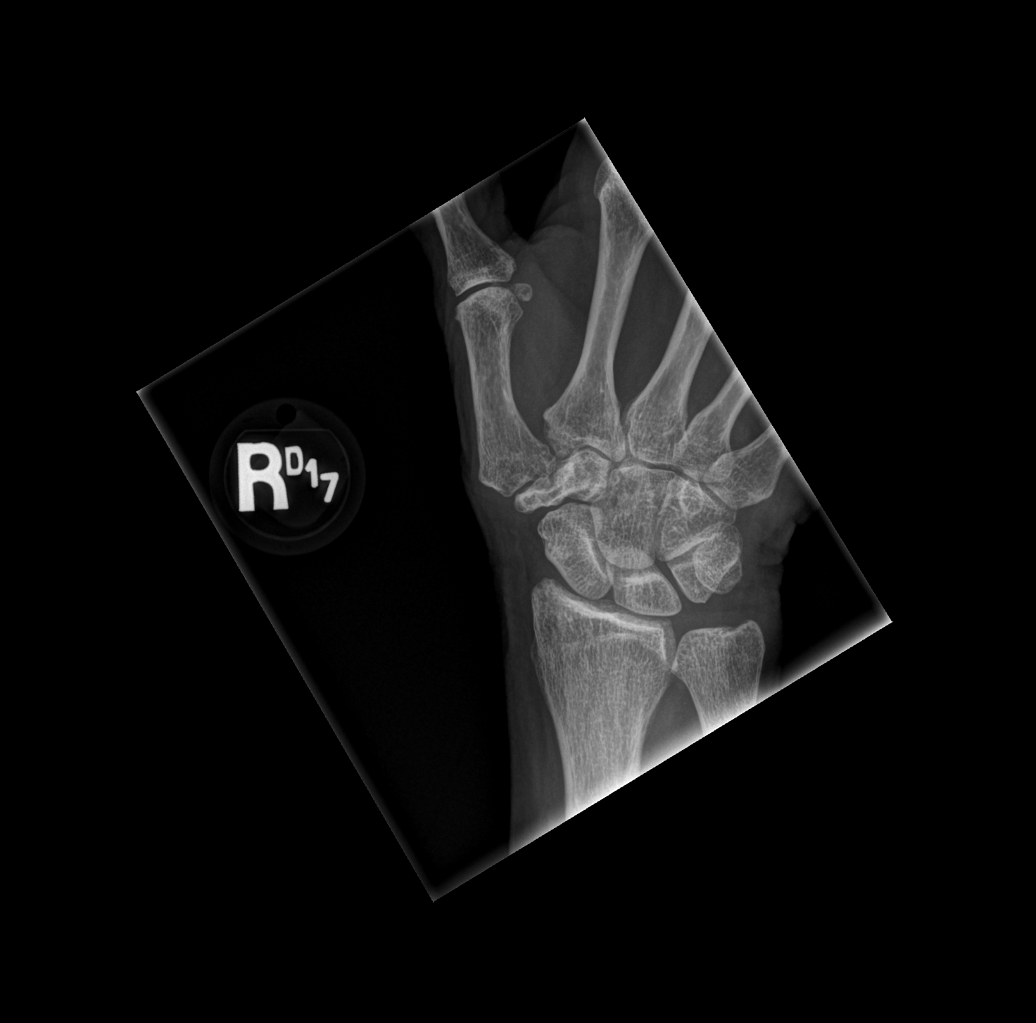

[4 of 4 positions shown; findings below may reference images not displayed]

FINDINGS: There is no evidence of fracture or dislocation. Moderate to marked
severity chronic and degenerative changes are seen involving the
right trapezium and right capitate bones as well as the
carpometacarpal articulation of the right thumb. Soft tissues are
unremarkable.
IMPRESSION: Moderate to marked severity chronic and degenerative changes as
described above.
